# Patient Record
Sex: Female | Born: 1980 | Race: Black or African American | Hispanic: No | Marital: Married
Health system: Southern US, Community
[De-identification: ages and names within clinical notes are randomized; demographics above are authoritative.]

---

## 2020-03-15 ENCOUNTER — Emergency Department (HOSPITAL_COMMUNITY): Payer: BC Managed Care – PPO

## 2020-03-15 ENCOUNTER — Other Ambulatory Visit: Payer: Self-pay

## 2020-03-15 ENCOUNTER — Emergency Department (HOSPITAL_COMMUNITY)
Admission: EM | Admit: 2020-03-15 | Discharge: 2020-03-15 | Disposition: A | Discharge status: Home / Self Care | Payer: BC Managed Care – PPO | Attending: Emergency Medicine | Admitting: Emergency Medicine

## 2020-03-15 ENCOUNTER — Encounter (HOSPITAL_COMMUNITY): Payer: Self-pay | Admitting: Emergency Medicine

## 2020-03-15 DIAGNOSIS — R079 Chest pain, unspecified: Secondary | ICD-10-CM | POA: Diagnosis present

## 2020-03-15 LAB — I-STAT BETA HCG BLOOD, ED (MC, WL, AP ONLY): I-stat hCG, quantitative: <5 m[IU]/mL (ref <5)

## 2020-03-15 LAB — BASIC METABOLIC PANEL
Anion gap: 10 (ref 5–15)
BUN: 16 mg/dL (ref 6–20)
CO2: 25 mmol/L (ref 22–32)
Calcium: 9.7 mg/dL (ref 8.9–10.3)
Chloride: 101 mmol/L (ref 98–111)
Creatinine, Ser: 0.86 mg/dL (ref 0.44–1.00)
GFR calc Af Amer: >60 mL/min (ref >60)
GFR calc non Af Amer: >60 mL/min (ref >60)
Glucose, Bld: 86 mg/dL (ref 70–99)
Potassium: 3.8 mmol/L (ref 3.5–5.1)
Sodium: 136 mmol/L (ref 135–145)

## 2020-03-15 LAB — CBC
HCT: 45.9 % (ref 36.0–46.0)
Hemoglobin: 14.9 g/dL (ref 12.0–15.0)
MCH: 30.3 pg (ref 26.0–34.0)
MCHC: 32.5 g/dL (ref 30.0–36.0)
MCV: 93.3 fL (ref 80.0–100.0)
Platelets: 353 10*3/uL (ref 150–400)
RBC: 4.92 MIL/uL (ref 3.87–5.11)
RDW: 14.9 % (ref 11.5–15.5)
WBC: 8.8 10*3/uL (ref 4.0–10.5)
nRBC: 0 % (ref 0.0–0.2)

## 2020-03-15 LAB — TROPONIN I (HIGH SENSITIVITY): Troponin I (High Sensitivity): <2 ng/L (ref <18)

## 2020-03-15 MED ORDER — KETOROLAC TROMETHAMINE 30 MG/ML IJ SOLN
30.0000 mg | Freq: Once | INTRAMUSCULAR | Status: AC
  Administered 2020-03-15: 30 mg via INTRAVENOUS
  Filled 2020-03-15: qty 1

## 2020-03-15 MED ORDER — IOHEXOL 350 MG/ML SOLN
100.0000 mL | Freq: Once | INTRAVENOUS | Status: AC | PRN
  Administered 2020-03-15: 23:00:00 100 mL via INTRAVENOUS

## 2020-03-15 NOTE — ED Notes (Signed)
Iv infiltration during CT power flush

## 2020-03-15 NOTE — Discharge Instructions (Addendum)
Your work-up today is very reassuring and does not show signs of a blood clot or other problems with your heart or lungs today.  May be musculoskeletal, you can treat this with anti-inflammatories like ibuprofen and Tylenol.  If symptoms or not improving you can follow-up with your PCP.  If you develop new or worsening chest pain, shortness of breath, feel lightheaded or like you may pass out or any other new or concerning symptoms occur return to the emergency department.

## 2020-03-15 NOTE — ED Triage Notes (Signed)
Patient complains of L anterior chest pain, states it feels like something is pulling. She noticed it earlier today. Has a hx of blood clots in her lungs. Denies SOB, N/V/D. Denies radiation.

## 2020-03-15 NOTE — ED Provider Notes (Signed)
[facility]-EMERGENCY DEPT Provider Note   CSN: [id number] Arrival date & time: 03/15/20  1631     History Chief Complaint  Patient presents with  . Chest Pain    Marisa Griffin is a 39 y.o. female.  Marisa Griffin is a 39 y.o. 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  She reports this pain began earlier today, and came on suddenly and has gotten worse throughout the day.  She reports it feels like a pulling or pressure in the left side of her chest.  It is nonradiating.  She does report it is worse with deep breathing, and with some movements.  She denies associated shortness of breath.  Reports she had a mild pain in her chest briefly yesterday that went away.  Reports this feels somewhat similar to previous blood clot, but pain is more mild.  She states that initial blood clot happened about 10 years ago and she was initially on blood thinners and then they stopped some, she was on them prophylactically during her most recent pregnancy, but since then has not been on any.  No history of heart problems.  She denies any associated cough or fever.  No abdominal pain, nausea, vomiting.  No lightheadedness or syncope.  No lower extremity swelling or pain.  No recent long distance travel or surgeries.  Not on OCPs.  No meds prior to arrival to treat pain.  No other aggravating or alleviating factors.        History reviewed. No pertinent past medical history.  There are no problems to display for this patient.   History reviewed. No pertinent surgical history.   OB History   No obstetric history on file.     No family history on file.  Social History   Tobacco Use  . Smoking status: Not on file  Substance Use Topics  . Alcohol use: Not on file  . Drug use: Not on file    Home Medications Prior to Admission medications   Medication Sig Start Date End Date Taking? Authorizing Provider    albuterol (VENTOLIN HFA) 108 (90 Base) MCG/ACT inhaler Inhale 2 puffs into the lungs as needed for wheezing or shortness of breath. 10/20/19  Yes [provider]  promethazine (PHENERGAN) 25 MG tablet Take 25 mg by mouth as needed for cough. 11/30/19  Yes [provider]    Allergies    Molds & smuts, Other, Penicillins, Justicia adhatoda, Chocolate, and Wheat bran  Review of Systems   Review of Systems  Constitutional: Negative for chills and fever.  HENT: Negative.   Respiratory: Negative for cough and shortness of breath.   Cardiovascular: Positive for chest pain. Negative for palpitations and leg swelling.  Gastrointestinal: Negative for abdominal pain, diarrhea, nausea and vomiting.  Musculoskeletal: Negative for arthralgias, back pain and myalgias.  Skin: Negative for color change and rash.  Neurological: Negative for dizziness, syncope and light-headedness.  All other systems reviewed and are negative.   Physical Exam Updated Vital Signs BP 117/81 (BP Location: Left Arm)   Pulse 71 Comment: Pt resting in bed with no needs at this time.  Temp 98.5 F (36.9 C) (Oral)   Resp 18   Ht 5\' 6"  (1.676 m)   Wt 88 kg   SpO2 100%   BMI 31.31 kg/m   Physical Exam Vitals and nursing note reviewed.  Constitutional:      General: She is not  in acute distress.    Appearance: She is well-developed. She is not ill-appearing or diaphoretic.  HENT:     Head: Normocephalic and atraumatic.  Eyes:     General:        Right eye: No discharge.        Left eye: No discharge.  Cardiovascular:     Rate and Rhythm: Normal rate and regular rhythm.     Pulses:          Radial pulses are 2+ on the right side and 2+ on the left side.     Heart sounds: Normal heart sounds. No murmur. No friction rub. No gallop.   Pulmonary:     Effort: Pulmonary effort is normal. No respiratory distress.     Breath sounds: Normal breath sounds. No wheezing or rales.     Comments:  Respirations equal and unlabored, patient able to speak in full sentences, lungs clear to auscultation bilaterally Chest:     Chest wall: No tenderness.  Abdominal:     General: Bowel sounds are normal. There is no distension.     Palpations: Abdomen is soft. There is no mass.     Tenderness: There is no abdominal tenderness. There is no guarding.     Comments: Abdomen soft, nondistended, nontender to palpation in all quadrants without guarding or peritoneal signs  Musculoskeletal:        General: No deformity.     Cervical back: Neck supple.     Right lower leg: No tenderness. No edema.     Left lower leg: No tenderness. No edema.  Skin:    General: Skin is warm and dry.     Capillary Refill: Capillary refill takes less than 2 seconds.  Neurological:     Mental Status: She is alert.     Coordination: Coordination normal.     Comments: Speech is clear, able to follow commands Moves extremities without ataxia, coordination intact  Psychiatric:        Mood and Affect: Mood normal.        Behavior: Behavior normal.     ED Results / Procedures / Treatments   Labs (all labs ordered are listed, but only abnormal results are displayed) Labs Reviewed  BASIC METABOLIC PANEL  CBC  I-STAT BETA HCG BLOOD, ED ([facility], [facility], [facility] ONLY)  TROPONIN I (HIGH SENSITIVITY)    EKG EKG Interpretation  Date/Time:  Friday Mar 15 2020 16:46:25 EDT Ventricular Rate:  90 PR Interval:    QRS Duration: 82 QT Interval:  356 QTC Calculation: 436 R Axis:   52 Text Interpretation: Sinus rhythm Probable left atrial enlargement Baseline wander in lead(s) V2 No old tracing to compare Confirmed by Mancel Bale [phone]) on 03/15/2020 10:32:02 PM   Radiology DG Chest 2 View  Result Date: 03/15/2020 CLINICAL DATA:  Patient complains of L anterior chest pain, states it feels like something is pulling. She noticed it earlier today. Has a hx of blood clots in her lungs. Denies SOB, N/V/D. Denies radiation. EXAM:  CHEST - 2 VIEW COMPARISON:  None. FINDINGS: The heart size and mediastinal contours are within normal limits. Both lungs are clear. No pleural effusion or pneumothorax. The visualized skeletal structures are unremarkable. IMPRESSION: Normal chest radiographs. Electronically Signed   By: Amie Portland M.D.   On: 03/15/2020 18:11   CT Angio Chest PE W and/or Wo Contrast  Result Date: 03/15/2020 CLINICAL DATA:  Left anterior chest pain. Feels like something is pulling. Reports a history  of previous blood clots in the lungs. No reported shortness of breath. EXAM: CT ANGIOGRAPHY CHEST WITH CONTRAST TECHNIQUE: Multidetector CT imaging of the chest was performed using the standard protocol during bolus administration of intravenous contrast. Multiplanar CT image reconstructions and MIPs were obtained to evaluate the vascular anatomy. CONTRAST:  128mL OMNIPAQUE IOHEXOL 350 MG/ML SOLN COMPARISON:  Current chest radiograph FINDINGS: Cardiovascular: There is satisfactory opacification of the pulmonary arteries to the proximal segmental level. There is no evidence of a pulmonary embolism. Heart is normal in size. No pericardial effusion or coronary artery calcifications. Great vessels are normal in caliber. Aorta is mostly unopacified. No aortic atherosclerotic calcifications. Mediastinum/Nodes: No neck base, mediastinal or hilar masses or enlarged lymph nodes. Normal trachea and esophagus. Lungs/Pleura: Lungs are clear. No pleural effusion or pneumothorax. Upper Abdomen: Visualized upper abdominal structures are within normal limits. Musculoskeletal: Mild disc degenerative changes of the mid to lower thoracic spine. No fracture or bone lesion. No chest wall mass. Review of the MIP images confirms the above findings. IMPRESSION: 1. No evidence of a pulmonary embolism. 2. No acute findings.  Clear lungs. Electronically Signed   By: Lajean Manes M.D.   On: 03/15/2020 22:46    Procedures Procedures (including critical care  time)  Medications Ordered in ED Medications  iohexol (OMNIPAQUE) 350 MG/ML injection 100 mL (100 mLs Intravenous Contrast Given 03/15/20 2240)  ketorolac (TORADOL) 30 MG/ML injection 30 mg (30 mg Intravenous Given 03/15/20 2331)    ED Course  I have reviewed the triage vital signs and the nursing notes.  Pertinent labs & imaging results that were available during my care of the patient were reviewed by me and considered in my medical decision making (see chart for details).    MDM Rules/Calculators/A&P                     Patient presents to the emergency department with chest pain. Patient nontoxic appearing, in no apparent distress, vitals without significant abnormality. Fairly benign physical exam. Hx of previous PE and not currently on anticoagulation.  DDX: ACS, pulmonary embolism, dissection, pneumothorax, pneumonia, arrhythmia, severe anemia, MSK, GERD, anxiety. Evaluation initiated with labs, EKG, and CXR. Patient on cardiac monitor.   CBC: No leukocytosis, normal hemoglobin BMP: No electrolyte derangements, normal renal function Troponin: Negative EKG: Sinus rhythm without ischemic changes XTG:[id number], without infiltrate, effusion, pneumothorax, or fracture/dislocation.  CTA Chest: No signs of PE or other acute abnormalities within the chest.  EKG without obvious acute ischemia, delta troponin negative, doubt ACS. CTA without evidence of PE or dissection. Cardiac monitor reviewed, no notable arrhythmias or tachycardia. Patient has appeared hemodynamically stable throughout ER visit and appears safe for discharge with close PCP/cardiology follow up. I discussed results, treatment plan, need for PCP follow-up, and return precautions with the patient. Provided opportunity for questions, patient confirmed understanding and is in agreement with plan.   Final Clinical Impression(s) / ED Diagnoses Final diagnoses:  Left-sided chest pain    Rx / DC Orders ED Discharge Orders     None       Jacqlyn Larsen, [place] 03/18/20 1327    Daleen Bo, MD 03/18/20 [phone]

## 2021-05-25 IMAGING — DX DG CHEST 2V
2 series · 2 of 2 positions shown · non-contrast
Comparison: None.

CLINICAL DATA: Patient complains of L anterior chest pain, states
it feels like something is pulling. She noticed it earlier today.
Has a hx of blood clots in her lungs. Denies SOB, N/V/D. Denies
radiation.

EXAM:
CHEST - 2 VIEW

[chest pa]
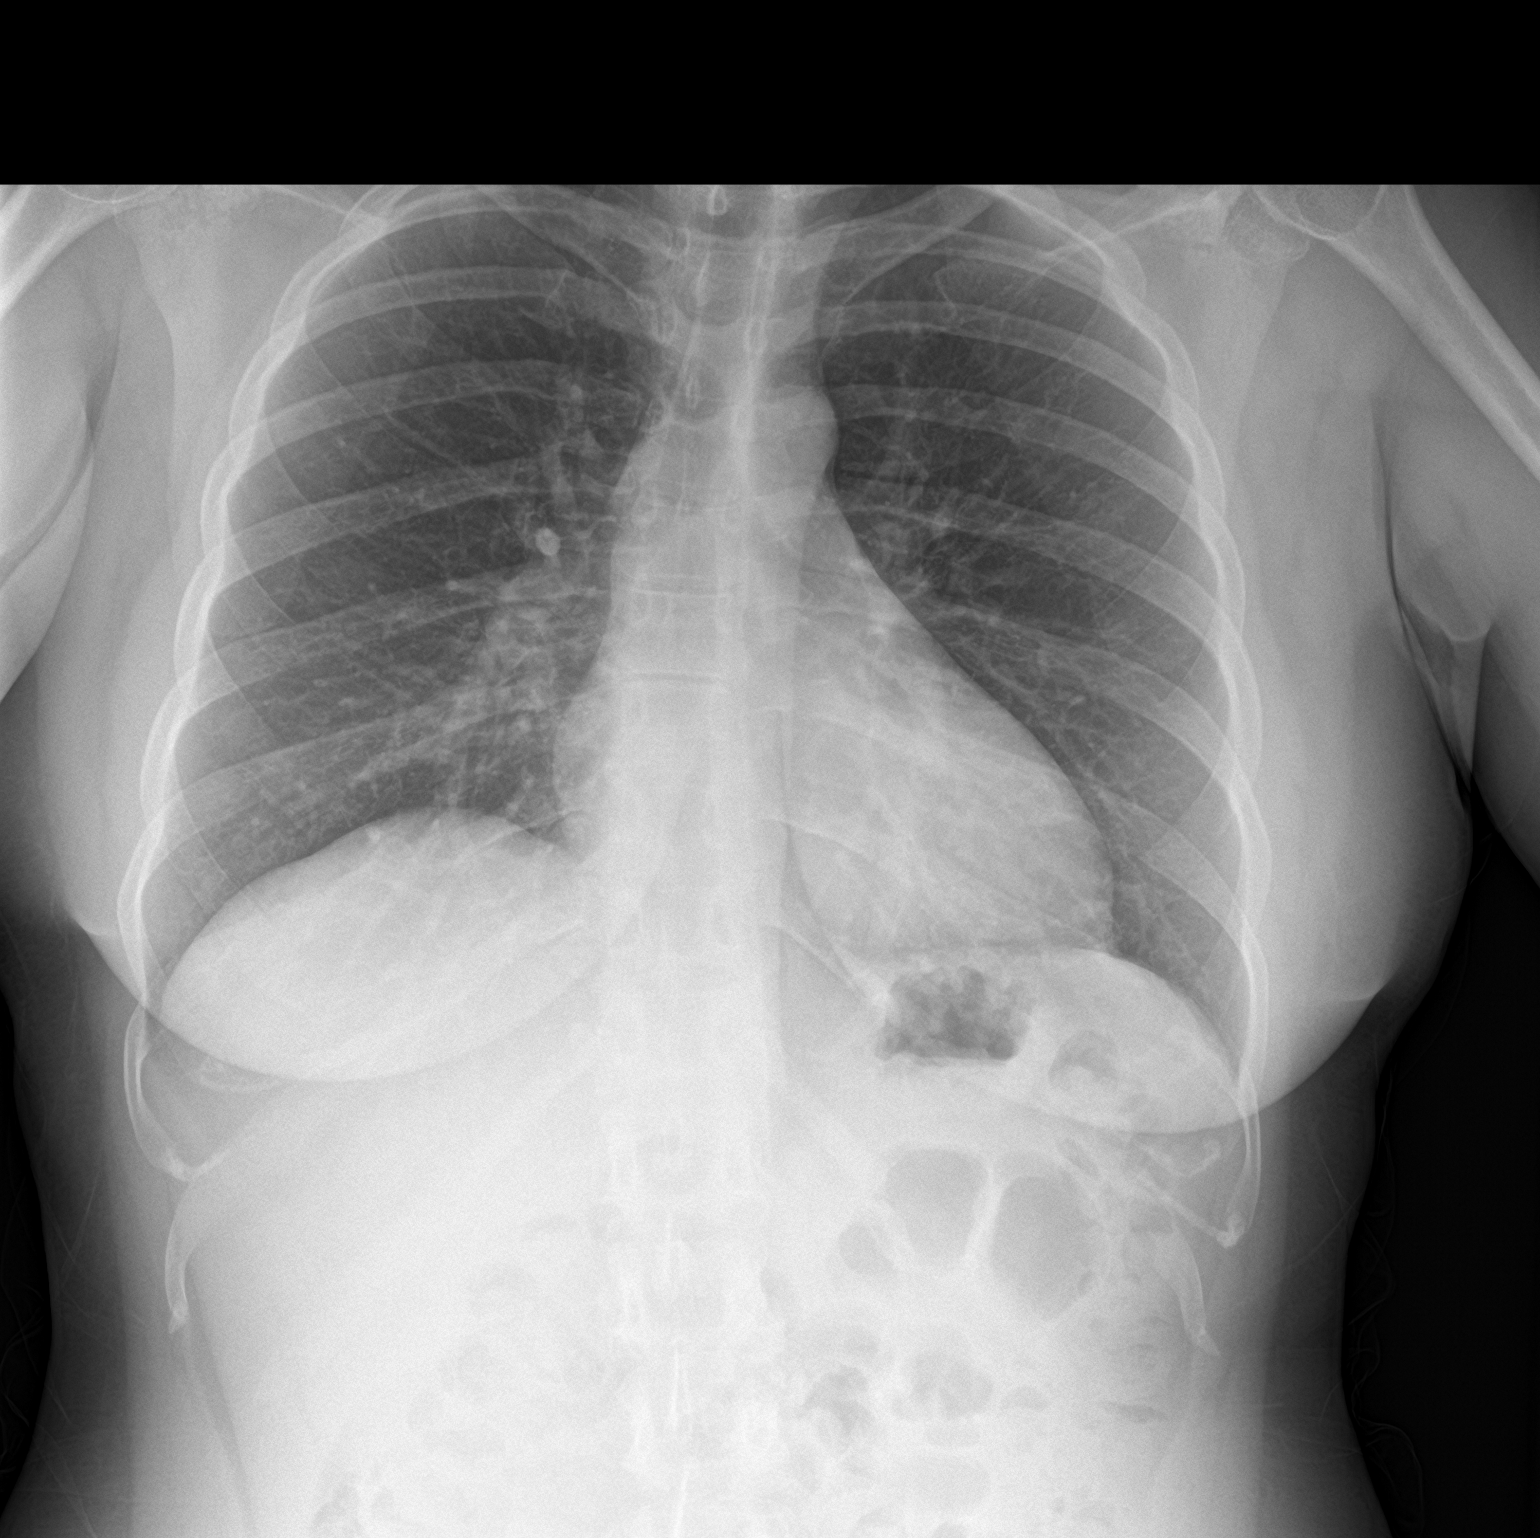

[chest lat]
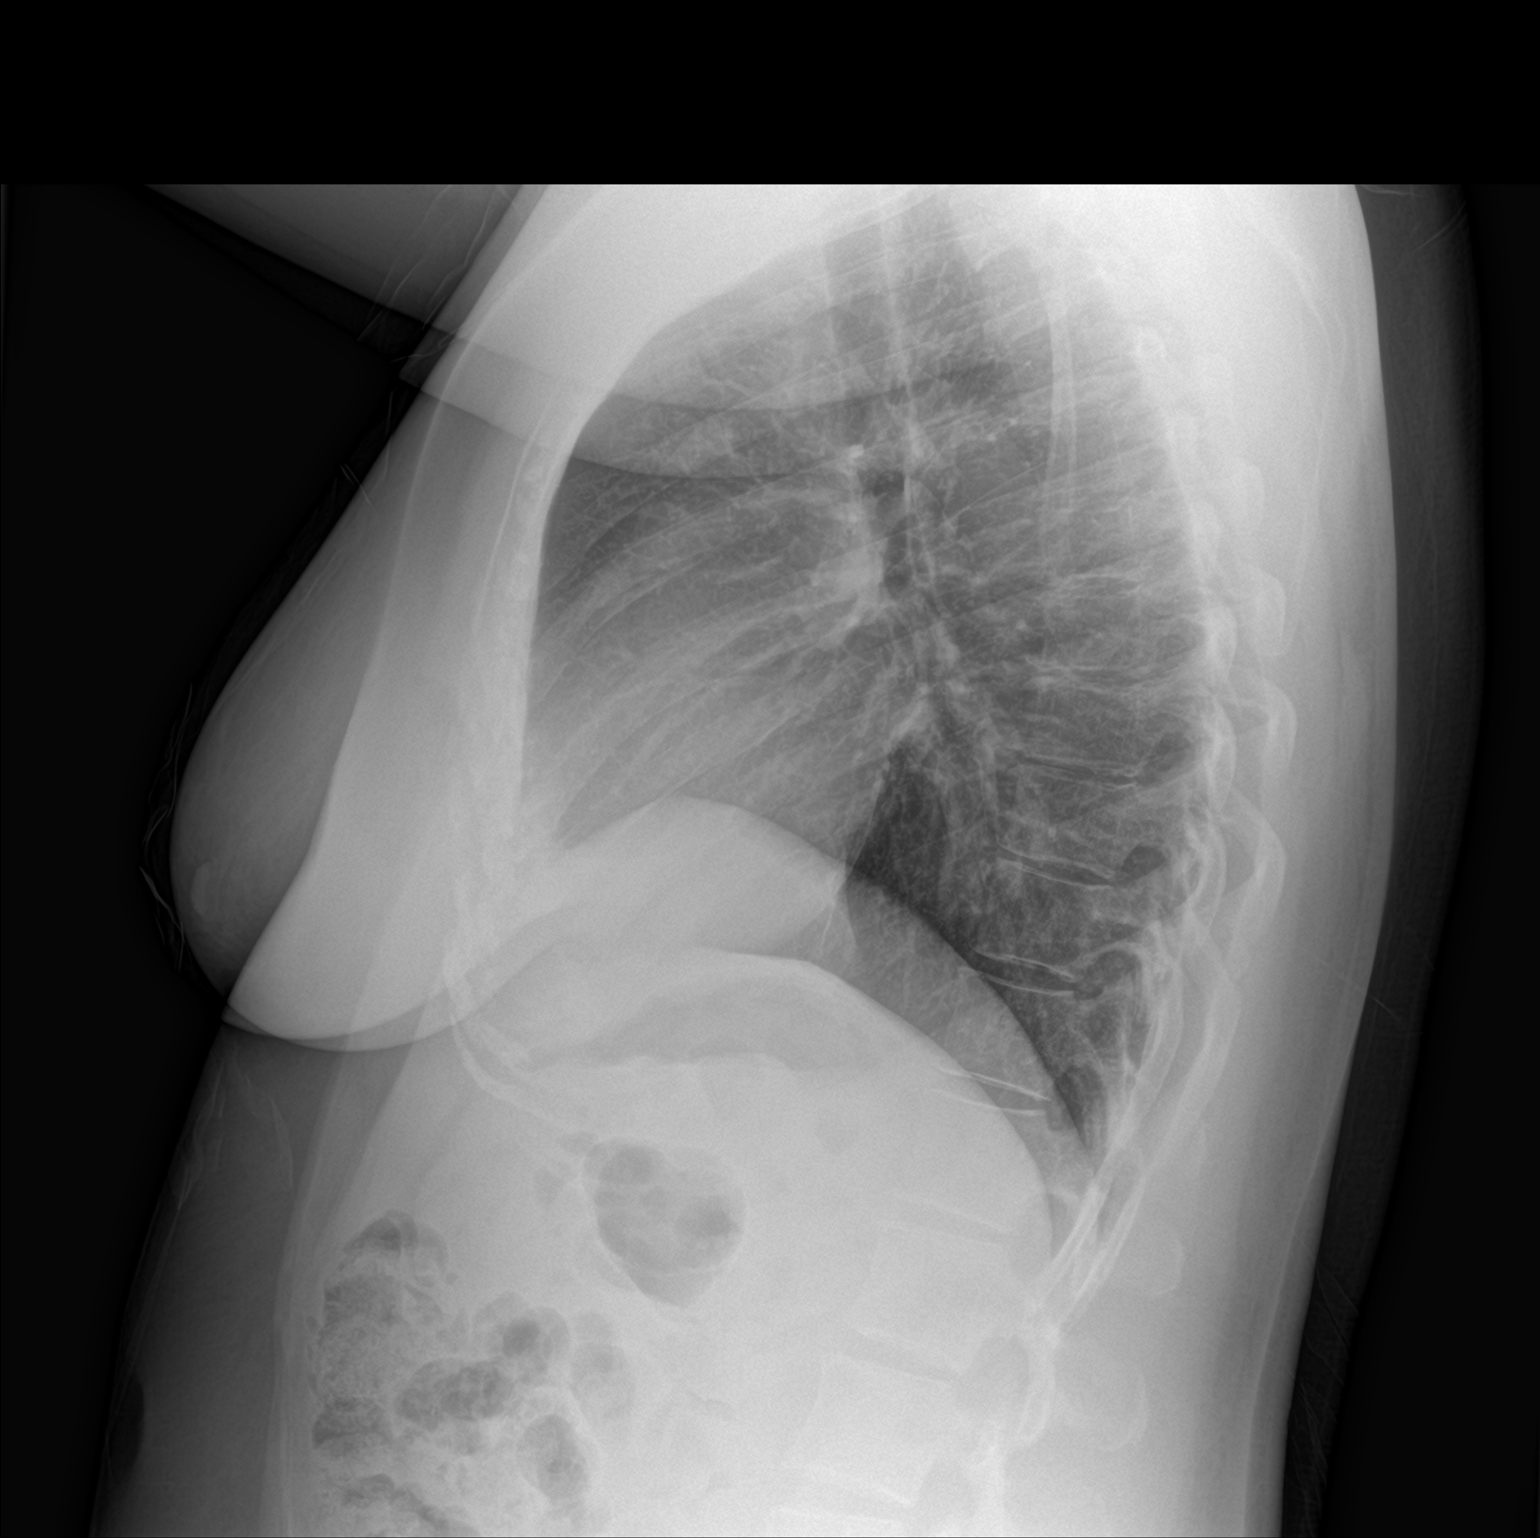

[2 of 2 positions shown; findings below may reference images not displayed]

FINDINGS: The heart size and mediastinal contours are within normal limits.
Both lungs are clear. No pleural effusion or pneumothorax. The
visualized skeletal structures are unremarkable.
IMPRESSION: Normal chest radiographs.

## 2021-05-25 IMAGING — CT CT ANGIO CHEST
2 of 6 series · 18 of 36 positions shown · IV contrast (omnipaque)
Comparison: Current chest radiograph

CLINICAL DATA: Left anterior chest pain. Feels like something is
pulling. Reports a history of previous blood clots in the lungs. No
reported shortness of breath.

EXAM:
CT ANGIOGRAPHY CHEST WITH CONTRAST
TECHNIQUE: Multidetector CT imaging of the chest was performed using the
standard protocol during bolus administration of intravenous
contrast. Multiplanar CT image reconstructions and MIPs were
obtained to evaluate the vascular anatomy.
CONTRAST:  100mL OMNIPAQUE IOHEXOL 350 MG/ML SOLN

[Series 5: thins · axial · 0.70mm/px · z∈[-276,-54]mm · 17 of 250 slices shown]
[im 14/250  lung]
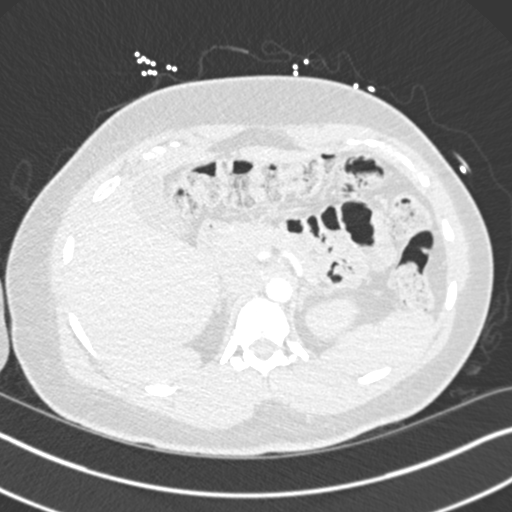
[im 28/250  mediastinal]
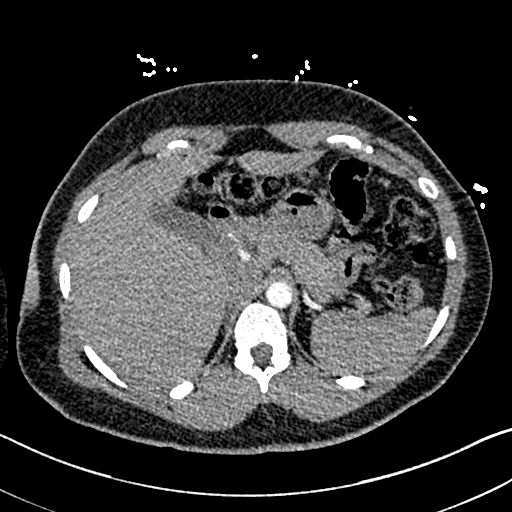
[im 42/250  lung]
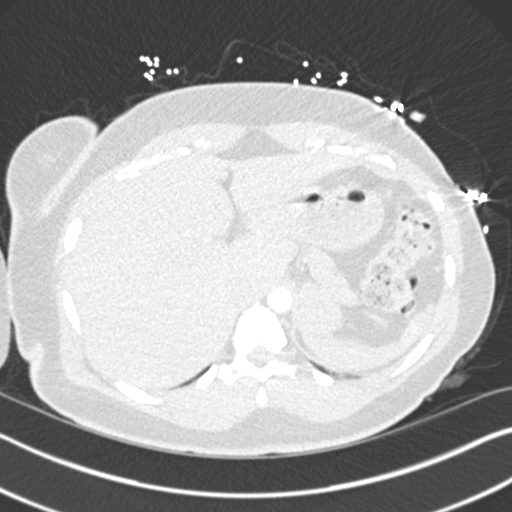
[im 56/250  mediastinal]
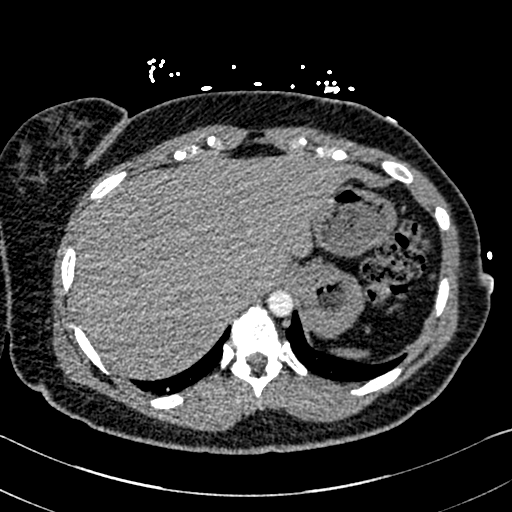
[im 70/250  lung]
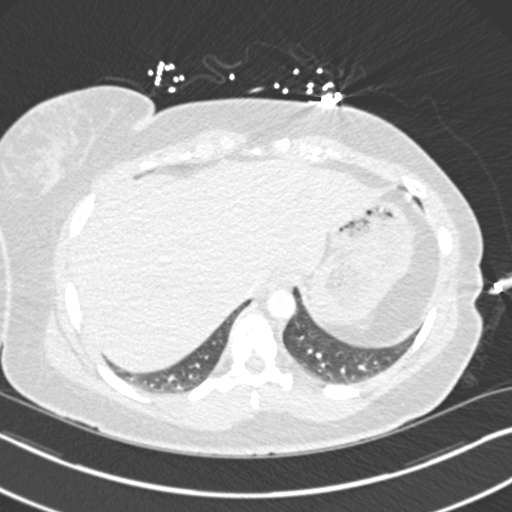
[im 84/250  mediastinal]
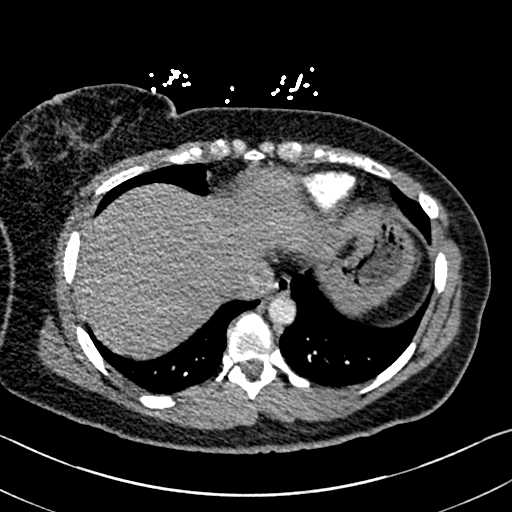
[im 97/250  lung]
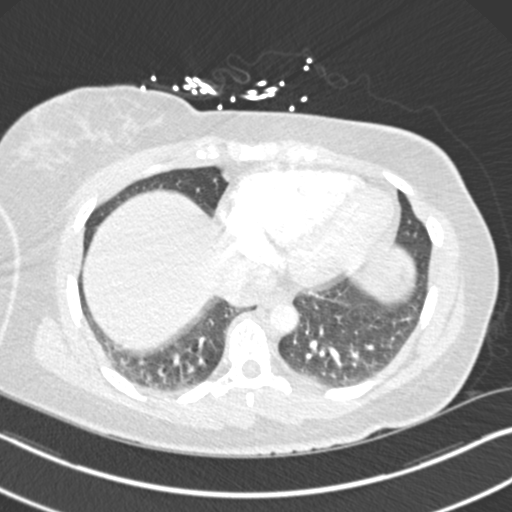
[im 111/250  mediastinal]
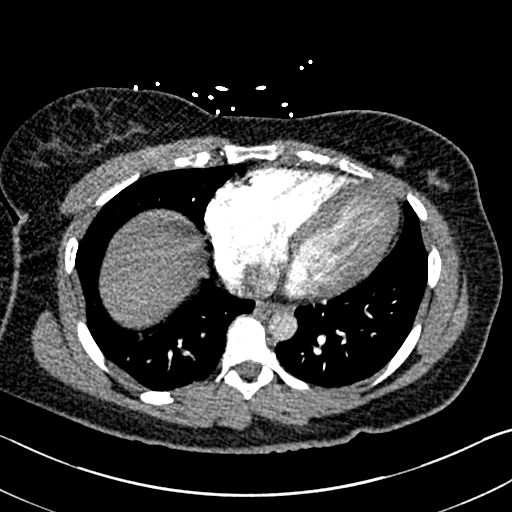
[im 125/250  lung]
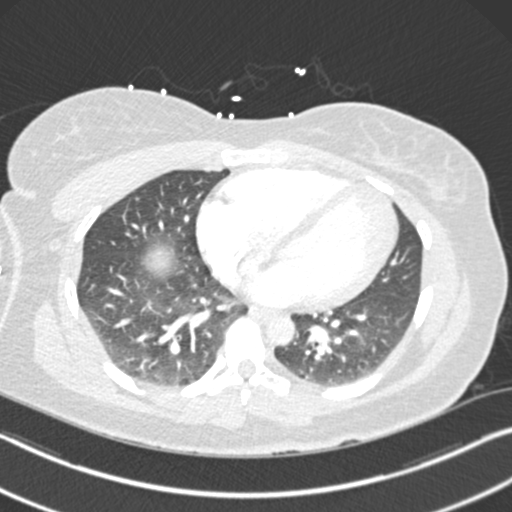
[im 139/250  mediastinal]
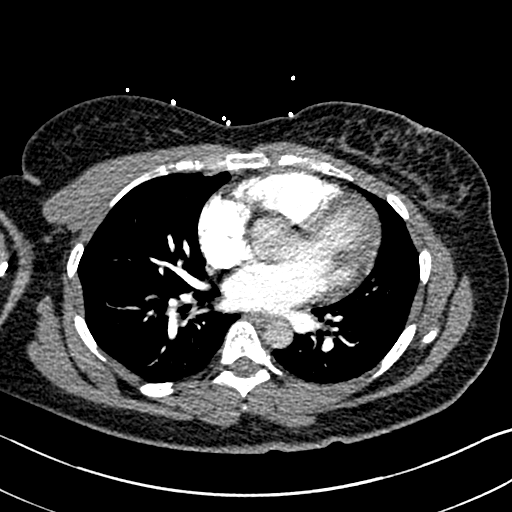
[im 153/250  lung]
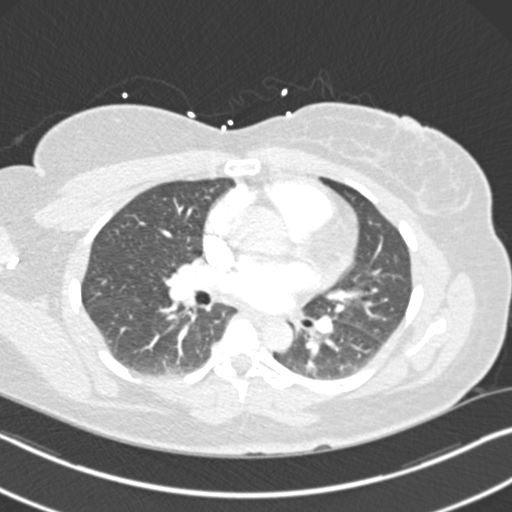
[im 167/250  mediastinal]
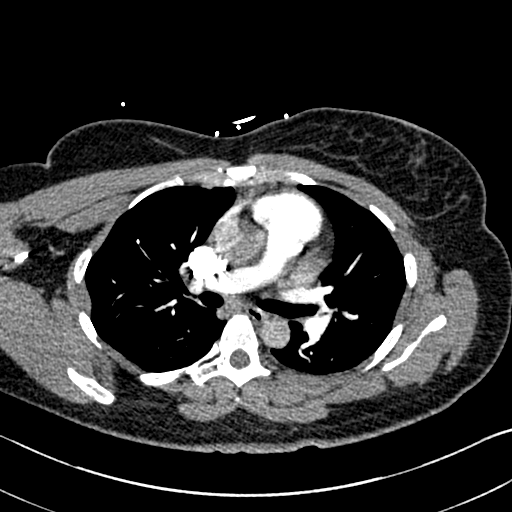
[im 180/250  lung]
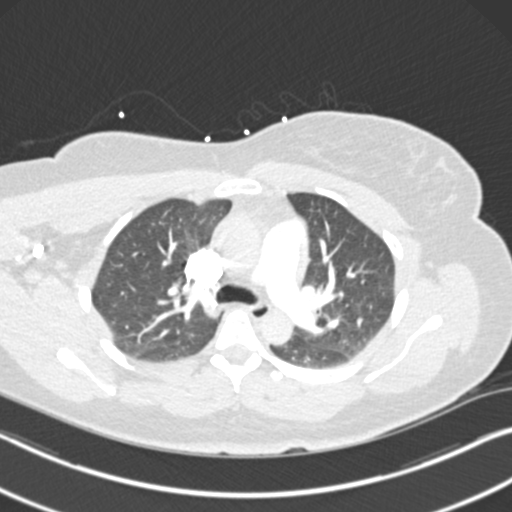
[im 194/250  mediastinal]
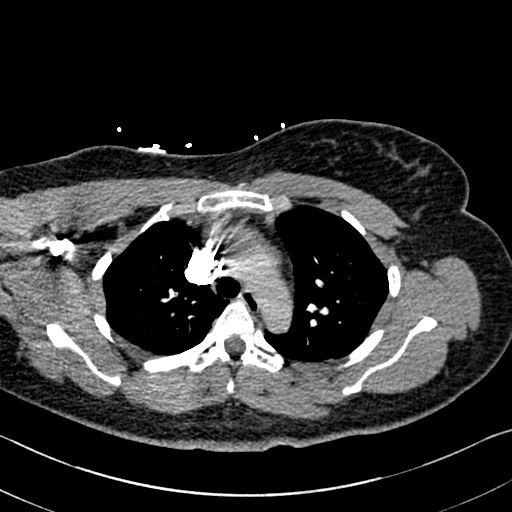
[im 208/250  lung]
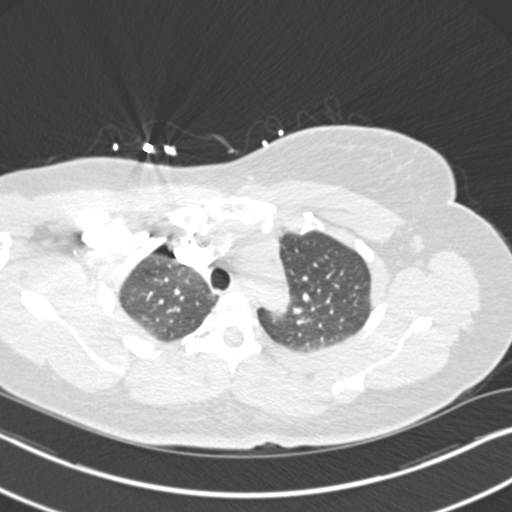
[im 222/250  mediastinal]
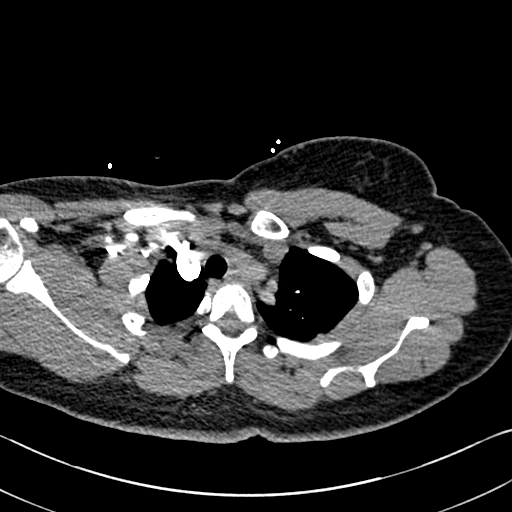
[im 236/250  lung]
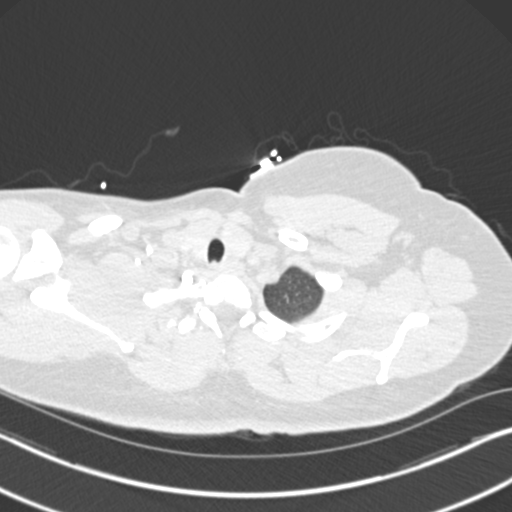

[Series 7: coronal mpr · coronal · 0.50mm/px · 1 of 142 slices shown]
[im 71/142  mediastinal]
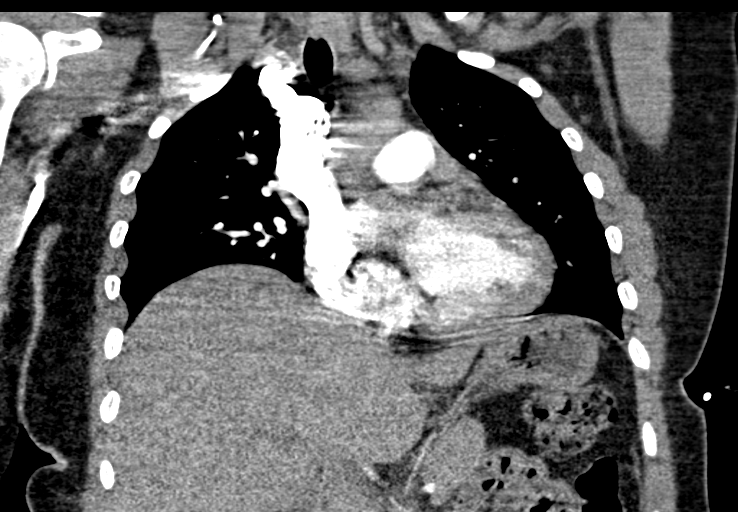

[18 of 36 positions shown; findings below may reference images not displayed]

FINDINGS: Cardiovascular: There is satisfactory opacification of the pulmonary
arteries to the proximal segmental level. There is no evidence of a
pulmonary embolism.

Heart is normal in size. No pericardial effusion or coronary artery
calcifications. Great vessels are normal in caliber. Aorta is mostly
unopacified. No aortic atherosclerotic calcifications.

Mediastinum/Nodes: No neck base, mediastinal or hilar masses or
enlarged lymph nodes. Normal trachea and esophagus.

Lungs/Pleura: Lungs are clear. No pleural effusion or pneumothorax.

Upper Abdomen: Visualized upper abdominal structures are within
normal limits.

Musculoskeletal: Mild disc degenerative changes of the mid to lower
thoracic spine. No fracture or bone lesion. No chest wall mass.

Review of the MIP images confirms the above findings.
IMPRESSION: 1. No evidence of a pulmonary embolism.
2. No acute findings.  Clear lungs.
